# Patient Record
Sex: Female | Born: 1950 | Race: Black or African American | Hispanic: No | Marital: Married | Smoking: Never smoker
Health system: Southern US, Community
[De-identification: ages and names within clinical notes are randomized; demographics above are authoritative.]

## PROBLEM LIST (undated history)

## (undated) DIAGNOSIS — E7800 Pure hypercholesterolemia, unspecified: Secondary | ICD-10-CM

## (undated) DIAGNOSIS — I10 Essential (primary) hypertension: Secondary | ICD-10-CM

## (undated) HISTORY — PX: ABDOMINAL HYSTERECTOMY: SHX81

## (undated) HISTORY — PX: TONSILLECTOMY: SUR1361

## (undated) HISTORY — PX: ANKLE SURGERY: SHX546

---

## 2015-02-03 ENCOUNTER — Emergency Department (HOSPITAL_COMMUNITY)
Admission: EM | Admit: 2015-02-03 | Discharge: 2015-02-03 | Disposition: A | Discharge status: Home / Self Care | Payer: BLUE CROSS/BLUE SHIELD | Source: Home / Self Care | Attending: Family Medicine | Admitting: Family Medicine

## 2015-02-03 ENCOUNTER — Encounter (HOSPITAL_COMMUNITY): Payer: Self-pay | Admitting: Emergency Medicine

## 2015-02-03 ENCOUNTER — Emergency Department (INDEPENDENT_AMBULATORY_CARE_PROVIDER_SITE_OTHER): Payer: BLUE CROSS/BLUE SHIELD

## 2015-02-03 DIAGNOSIS — S92512A Displaced fracture of proximal phalanx of left lesser toe(s), initial encounter for closed fracture: Secondary | ICD-10-CM

## 2015-02-03 DIAGNOSIS — T1490 Injury, unspecified: Secondary | ICD-10-CM | POA: Diagnosis not present

## 2015-02-03 DIAGNOSIS — T1490XA Injury, unspecified, initial encounter: Secondary | ICD-10-CM

## 2015-02-03 NOTE — Discharge Instructions (Signed)
You have fractured your pinky toe. Use ibuprofen for pain. Please wear the postop shoe all during daytime hours. Buddy tape as desired for additional relief. Follow-up with Dr.Kendall next Tuesday, October 4 further treatment. Please call the office on Monday to schedule the appointment.

## 2015-02-03 NOTE — ED Notes (Signed)
The patient reported to the [facility] with a complaint of a toe injury. The patient stated that she hit the small toe on her left foot on the dresser about 3:30am this morning.

## 2015-02-03 NOTE — ED Provider Notes (Signed)
CSN: [id number]     Arrival date & time 02/03/15  1722 History   First MD Initiated Contact with Patient 02/03/15 1826     Chief Complaint  Patient presents with  . Toe Injury   (Consider location/radiation/quality/duration/timing/severity/associated sxs/prior Treatment) HPI  Left pinky toe pain. Started approximately 3 AM this morning when patient was ambulating back from using the bathroom. Patient states that she inadvertently kicked the dresser which caused immediate toe pain. Toes mobile but very tender to palpation. No laceration to the toe. Improves injury to the toe. Able to ambulate though this does cause some discomfort   t.History reviewed. No pertinent past medical history. History reviewed. No pertinent past surgical history. Family History  Problem Relation Age of Onset  . Diabetes Other    Social History  Substance Use Topics  . Smoking status: None  . Smokeless tobacco: None  . Alcohol Use: None   OB History    No data available     Review of Systems Per HPI with all other pertinent systems negative.   Allergies  Flexeril  Home Medications   Prior to Admission medications   Medication Sig Start Date End Date Taking? Authorizing Provider  atorvastatin (LIPITOR) 10 MG tablet Take 10 mg by mouth daily.   Yes Historical Provider, MD  lisinopril (PRINIVIL,ZESTRIL) 40 MG tablet Take 40 mg by mouth daily.   Yes Historical Provider, MD   Meds Ordered and Administered this Visit  Medications - No data to display  BP 118/73 mmHg  Pulse 72  Temp(Src) 98.2 F (36.8 C) (Oral)  Resp 20  SpO2 96% No data found.   Physical Exam Physical Exam  Constitutional: oriented to person, place, and time. appears well-developed and well-nourished. No distress.  HENT:  Head: Normocephalic and atraumatic.  Eyes: EOMI. PERRL.  Neck: Normal range of motion.  Cardiovascular: RRR, no m/r/g, 2+ distal pulses,  Pulmonary/Chest: Effort normal and breath sounds normal. No  respiratory distress.  Abdominal: Soft. Bowel sounds are normal. NonTTP, no distension.  Musculoskeletal: Detailed tender to palpation, no laceration, sensation intact, movement intact..  Neurological: alert and oriented to person, place, and time.  Skin: Skin is warm. No rash noted. non diaphoretic.  Psychiatric: normal mood and affect. behavior is normal. Judgment and thought content normal.   ED Course  Procedures (including critical care time)  Labs Review Labs Reviewed - No data to display  Imaging Review Dg Foot Complete Left  02/03/2015   CLINICAL DATA:  Stubbed left fifth toe on dresser, with pain. Initial encounter.  EXAM: LEFT FOOT - COMPLETE 3+ VIEW  COMPARISON:  None.  FINDINGS: There is a mildly displaced oblique fracture through the fifth proximal phalanx, without evidence of intra-articular extension. Surrounding soft tissue swelling is noted.  No additional fractures are seen. Visualized joint spaces are preserved. A small plantar calcaneal spur is noted.  IMPRESSION: Mildly displaced oblique fracture through the fifth proximal phalanx, without evidence of intra-articular extension.   Electronically Signed   By: Roanna Raider M.D.   On: 02/03/2015 18:55     Visual Acuity Review  Right Eye Distance:   Left Eye Distance:   Bilateral Distance:    Right Eye Near:   Left Eye Near:    Bilateral Near:         MDM   1. Closed disp fracture of proximal phalanx of lesser toe of left foot, initial encounter   2. Trauma    Fractures noted above and plain film. Discussed  case with Dr. Shon Baton of on-call [facility] orthopedic surgery. Recommending follow-up with Dr. Penni Bombard next Tuesday. Will place patient in postop shoe and recommending NSAIDs and ice as needed.    Ozella Rocks, MD 02/03/15 [phone]

## 2015-09-08 ENCOUNTER — Emergency Department (HOSPITAL_COMMUNITY)
Admission: EM | Admit: 2015-09-08 | Discharge: 2015-09-08 | Disposition: A | Discharge status: Home / Self Care | Payer: BLUE CROSS/BLUE SHIELD | Attending: Emergency Medicine | Admitting: Emergency Medicine

## 2015-09-08 ENCOUNTER — Encounter (HOSPITAL_COMMUNITY): Payer: Self-pay

## 2015-09-08 ENCOUNTER — Emergency Department (HOSPITAL_COMMUNITY): Payer: BLUE CROSS/BLUE SHIELD

## 2015-09-08 DIAGNOSIS — I10 Essential (primary) hypertension: Secondary | ICD-10-CM | POA: Insufficient documentation

## 2015-09-08 DIAGNOSIS — R079 Chest pain, unspecified: Secondary | ICD-10-CM

## 2015-09-08 DIAGNOSIS — R0789 Other chest pain: Secondary | ICD-10-CM | POA: Diagnosis present

## 2015-09-08 DIAGNOSIS — Z79899 Other long term (current) drug therapy: Secondary | ICD-10-CM | POA: Diagnosis not present

## 2015-09-08 DIAGNOSIS — E7800 Pure hypercholesterolemia, unspecified: Secondary | ICD-10-CM | POA: Diagnosis not present

## 2015-09-08 HISTORY — DX: Pure hypercholesterolemia, unspecified: E78.00

## 2015-09-08 HISTORY — DX: Essential (primary) hypertension: I10

## 2015-09-08 LAB — BASIC METABOLIC PANEL
ANION GAP: 10 (ref 5–15)
BUN: 19 mg/dL (ref 6–20)
CO2: 29 mmol/L (ref 22–32)
Calcium: 10.3 mg/dL (ref 8.9–10.3)
Chloride: 103 mmol/L (ref 101–111)
Creatinine, Ser: 1.04 mg/dL — ABNORMAL HIGH (ref 0.44–1.00)
GFR calc non Af Amer: 56 mL/min — ABNORMAL LOW (ref >60)
Glucose, Bld: 94 mg/dL (ref 65–99)
POTASSIUM: 4.1 mmol/L (ref 3.5–5.1)
Sodium: 142 mmol/L (ref 135–145)

## 2015-09-08 LAB — CBC
HEMATOCRIT: 41.5 % (ref 36.0–46.0)
HEMOGLOBIN: 13.6 g/dL (ref 12.0–15.0)
MCH: 28.4 pg (ref 26.0–34.0)
MCHC: 32.8 g/dL (ref 30.0–36.0)
MCV: 86.6 fL (ref 78.0–100.0)
Platelets: 231 10*3/uL (ref 150–400)
RBC: 4.79 MIL/uL (ref 3.87–5.11)
RDW: 13.2 % (ref 11.5–15.5)
WBC: 6.5 10*3/uL (ref 4.0–10.5)

## 2015-09-08 LAB — I-STAT TROPONIN, ED: TROPONIN I, POC: 0 ng/mL (ref 0.00–0.08)

## 2015-09-08 LAB — TROPONIN I: Troponin I: <0.03 ng/mL (ref <0.031)

## 2015-09-08 LAB — D-DIMER, QUANTITATIVE (NOT AT ARMC)

## 2015-09-08 MED ORDER — KETOROLAC TROMETHAMINE 30 MG/ML IJ SOLN
15.0000 mg | Freq: Once | INTRAMUSCULAR | Status: AC
  Administered 2015-09-08: 15 mg via INTRAVENOUS
  Filled 2015-09-08: qty 1

## 2015-09-08 MED ORDER — METHOCARBAMOL 500 MG PO TABS: 500.0000 mg | ORAL_TABLET | Freq: Two times a day (BID) | ORAL | Status: AC

## 2015-09-08 NOTE — ED Notes (Signed)
Unsuccessful IV attempts by this RN due to patient movement - attempted to calm patient prior to IV start without success.  Labs delayed due to inability to get IV access.

## 2015-09-08 NOTE — ED Provider Notes (Signed)
CSN: [id number]     Arrival date & time 09/08/15  1231 History   First MD Initiated Contact with Patient 09/08/15 1535     Chief Complaint  Patient presents with  . Chest Pain     (Consider location/radiation/quality/duration/timing/severity/associated sxs/prior Treatment) HPI Comments: Patient here complaining of left-sided chest tightness that has been waxing and waning 24 hours. No associated dyspnea, diaphoresis, nausea vomiting. No leg pain. No associated syncope or presyncope. No prior history of same.  Palpitations started last night but she attributes that to be started on Cardizem recently. No cough or congestion. She denies any history of cardiac disease. Feels at her baseline.  Patient is a 65 y.o. female presenting with chest pain. The history is provided by the patient.  Chest Pain   Past Medical History  Diagnosis Date  . Hypertension   . High cholesterol    Past Surgical History  Procedure Laterality Date  . Abdominal hysterectomy    . Ankle surgery    . Cesarean section     Family History  Problem Relation Age of Onset  . Diabetes Other    Social History  Substance Use Topics  . Smoking status: Never Smoker   . Smokeless tobacco: Never Used  . Alcohol Use: No   OB History    No data available     Review of Systems  Cardiovascular: Positive for chest pain.  All other systems reviewed and are negative.     Allergies  Clarithromycin; Flexeril; and Lisinopril  Home Medications   Prior to Admission medications   Medication Sig Start Date End Date Taking? Authorizing Provider  atorvastatin (LIPITOR) 10 MG tablet Take 10 mg by mouth daily.    Historical Provider, MD  lisinopril (PRINIVIL,ZESTRIL) 40 MG tablet Take 40 mg by mouth daily.    Historical Provider, MD   BP 139/52 mmHg  Pulse 64  Temp(Src) 98 F (36.7 C) (Oral)  Resp 19  Ht 5\' 1"  (1.549 m)  Wt 77.111 kg  BMI 32.14 kg/m2  SpO2 96% Physical Exam  Constitutional: She is oriented to  person, place, and time. She appears well-developed and well-nourished.  Non-toxic appearance. No distress.  HENT:  Head: Normocephalic and atraumatic.  Eyes: Conjunctivae, EOM and lids are normal. Pupils are equal, round, and reactive to light.  Neck: Normal range of motion. Neck supple. No tracheal deviation present. No thyroid mass present.  Cardiovascular: Normal rate, regular rhythm and normal heart sounds.  Exam reveals no gallop.   No murmur heard. Pulmonary/Chest: Effort normal and breath sounds normal. No stridor. No respiratory distress. She has no decreased breath sounds. She has no wheezes. She has no rhonchi. She has no rales.  Abdominal: Soft. Normal appearance and bowel sounds are normal. She exhibits no distension. There is no tenderness. There is no rebound and no CVA tenderness.  Musculoskeletal: Normal range of motion. She exhibits no edema or tenderness.  Neurological: She is alert and oriented to person, place, and time. She has normal strength. No cranial nerve deficit or sensory deficit. GCS eye subscore is 4. GCS verbal subscore is 5. GCS motor subscore is 6.  Skin: Skin is warm and dry. No abrasion and no rash noted.  Psychiatric: She has a normal mood and affect. Her speech is normal and behavior is normal.  Nursing note and vitals reviewed.   ED Course  Procedures (including critical care time) Labs Review Labs Reviewed  BASIC METABOLIC PANEL - Abnormal; Notable for the following:  Creatinine, Ser 1.04 (*)    GFR calc non Af Amer 56 (*)    All other components within normal limits  CBC  D-DIMER, QUANTITATIVE (NOT AT [facility])  TROPONIN I  Rosezena Sensor, ED    Imaging Review Dg Chest 2 View  09/08/2015  CLINICAL DATA:  Chest pain shortness of breath. EXAM: CHEST - 2 VIEW COMPARISON:  None. FINDINGS: The heart size and mediastinal contours are within normal limits. Both lungs are clear. The visualized skeletal structures are unremarkable. IMPRESSION: Negative  two view chest x-ray Electronically Signed   By: Marin Roberts M.D.   On: 09/08/2015 13:09   I have personally reviewed and evaluated these images and lab results as part of my medical decision-making.   EKG Interpretation   Date/Time:  Friday Sep 08 2015 12:42:30 EDT Ventricular Rate:  57 PR Interval:  180 QRS Duration: 83 QT Interval:  397 QTC Calculation: 386 R Axis:   42 Text Interpretation:  Sinus rhythm Low voltage, precordial leads RSR' in  V1 or V2, right VCD or RVH Confirmed by Maricela Schreur  MD, Gil Ingwersen ([id number]) on  09/08/2015 3:36:02 PM      MDM   Final diagnoses:  None   Patient with delta troponin that was negative. She also had d-dimer that was negative. Was given IV dose of Toradol.. Do not think that this represents ACS. She is stable for discharge     Lorre Nick, MD 09/08/15 [phone]

## 2015-09-08 NOTE — Discharge Instructions (Signed)
Nonspecific Chest Pain  °Chest pain can be caused by many different conditions. There is always a chance that your pain could be related to something serious, such as a heart attack or a blood clot in your lungs. Chest pain can also be caused by conditions that are not life-threatening. If you have chest pain, it is very important to follow up with your health care provider. °CAUSES  °Chest pain can be caused by: °· Heartburn. °· Pneumonia or bronchitis. °· Anxiety or stress. °· Inflammation around your heart (pericarditis) or lung (pleuritis or pleurisy). °· A blood clot in your lung. °· A collapsed lung (pneumothorax). It can develop suddenly on its own (spontaneous pneumothorax) or from trauma to the chest. °· Shingles infection (varicella-zoster virus). °· Heart attack. °· Damage to the bones, muscles, and cartilage that make up your chest wall. This can include: °¨ Bruised bones due to injury. °¨ Strained muscles or cartilage due to frequent or repeated coughing or overwork. °¨ Fracture to one or more ribs. °¨ Sore cartilage due to inflammation (costochondritis). °RISK FACTORS  °Risk factors for chest pain may include: °· Activities that increase your risk for trauma or injury to your chest. °· Respiratory infections or conditions that cause frequent coughing. °· Medical conditions or overeating that can cause heartburn. °· Heart disease or family history of heart disease. °· Conditions or health behaviors that increase your risk of developing a blood clot. °· Having had chicken pox (varicella zoster). °SIGNS AND SYMPTOMS °Chest pain can feel like: °· Burning or tingling on the surface of your chest or deep in your chest. °· Crushing, pressure, aching, or squeezing pain. °· Dull or sharp pain that is worse when you move, cough, or take a deep breath. °· Pain that is also felt in your back, neck, shoulder, or arm, or pain that spreads to any of these areas. °Your chest pain may come and go, or it may stay  constant. °DIAGNOSIS °Lab tests or other studies may be needed to find the cause of your pain. Your health care provider may have you take a test called an ambulatory ECG (electrocardiogram). An ECG records your heartbeat patterns at the time the test is performed. You may also have other tests, such as: °· Transthoracic echocardiogram (TTE). During echocardiography, sound waves are used to create a picture of all of the heart structures and to look at how blood flows through your heart. °· Transesophageal echocardiogram (TEE). This is a more advanced imaging test that obtains images from inside your body. It allows your health care provider to see your heart in finer detail. °· Cardiac monitoring. This allows your health care provider to monitor your heart rate and rhythm in real time. °· Holter monitor. This is a portable device that records your heartbeat and can help to diagnose abnormal heartbeats. It allows your health care provider to track your heart activity for several days, if needed. °· Stress tests. These can be done through exercise or by taking medicine that makes your heart beat more quickly. °· Blood tests. °· Imaging tests. °TREATMENT  °Your treatment depends on what is causing your chest pain. Treatment may include: °· Medicines. These may include: °¨ Acid blockers for heartburn. °¨ Anti-inflammatory medicine. °¨ Pain medicine for inflammatory conditions. °¨ Antibiotic medicine, if an infection is present. °¨ Medicines to dissolve blood clots. °¨ Medicines to treat coronary artery disease. °· Supportive care for conditions that do not require medicines. This may include: °¨ Resting. °¨ Applying heat   or cold packs to injured areas. °¨ Limiting activities until pain decreases. °HOME CARE INSTRUCTIONS °· If you were prescribed an antibiotic medicine, finish it all even if you start to feel better. °· Avoid any activities that bring on chest pain. °· Do not use any tobacco products, including  cigarettes, chewing tobacco, or electronic cigarettes. If you need help quitting, ask your health care provider. °· Do not drink alcohol. °· Take medicines only as directed by your health care provider. °· Keep all follow-up visits as directed by your health care provider. This is important. This includes any further testing if your chest pain does not go away. °· If heartburn is the cause for your chest pain, you may be told to keep your head raised (elevated) while sleeping. This reduces the chance that acid will go from your stomach into your esophagus. °· Make lifestyle changes as directed by your health care provider. These may include: °¨ Getting regular exercise. Ask your health care provider to suggest some activities that are safe for you. °¨ Eating a heart-healthy diet. A registered dietitian can help you to learn healthy eating options. °¨ Maintaining a healthy weight. °¨ Managing diabetes, if necessary. °¨ Reducing stress. °SEEK MEDICAL CARE IF: °· Your chest pain does not go away after treatment. °· You have a rash with blisters on your chest. °· You have a fever. °SEEK IMMEDIATE MEDICAL CARE IF:  °· Your chest pain is worse. °· You have an increasing cough, or you cough up blood. °· You have severe abdominal pain. °· You have severe weakness. °· You faint. °· You have chills. °· You have sudden, unexplained chest discomfort. °· You have sudden, unexplained discomfort in your arms, back, neck, or jaw. °· You have shortness of breath at any time. °· You suddenly start to sweat, or your skin gets clammy. °· You feel nauseous or you vomit. °· You suddenly feel light-headed or dizzy. °· Your heart begins to beat quickly, or it feels like it is skipping beats. °These symptoms may represent a serious problem that is an emergency. Do not wait to see if the symptoms will go away. Get medical help right away. Call your local emergency services (911 in the [place] Do not drive yourself to the hospital. °  °This  information is not intended to replace advice given to you by your health care provider. Make sure you discuss any questions you have with your health care provider. °  °Document Released: 01/30/2005 Document Revised: 05/13/2014 Document Reviewed: 11/26/2013 °Elsevier Interactive Patient Education ©2016 [employer] ° °

## 2015-09-08 NOTE — ED Notes (Signed)
Pt c/o 5/10 left sided cp described as "tightness" w/ radiation to left arm and lightheadedness. Pt denies n/v and diaphoresis. Pt denies cardiac hx. Pt A+OX4, speaking in complete sentences.

## 2015-09-08 NOTE — ED Notes (Signed)
Nurse drawing labs. 

## 2017-02-27 IMAGING — DX DG FOOT COMPLETE 3+V*L*
3 series · 3 of 3 positions shown · non-contrast
Comparison: None.

CLINICAL DATA: Stubbed left fifth toe on dresser, with pain.
Initial encounter.

EXAM:
LEFT FOOT - COMPLETE 3+ VIEW

[foot ap]
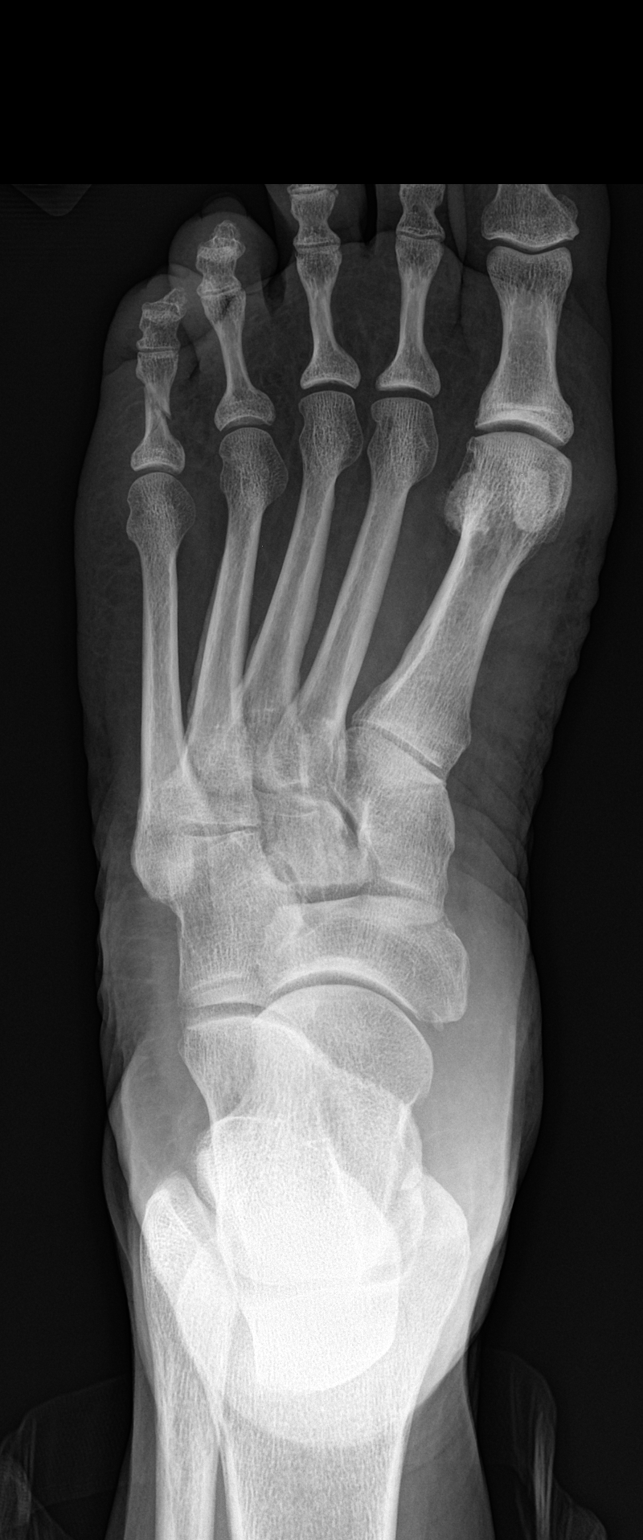

[foot obl]
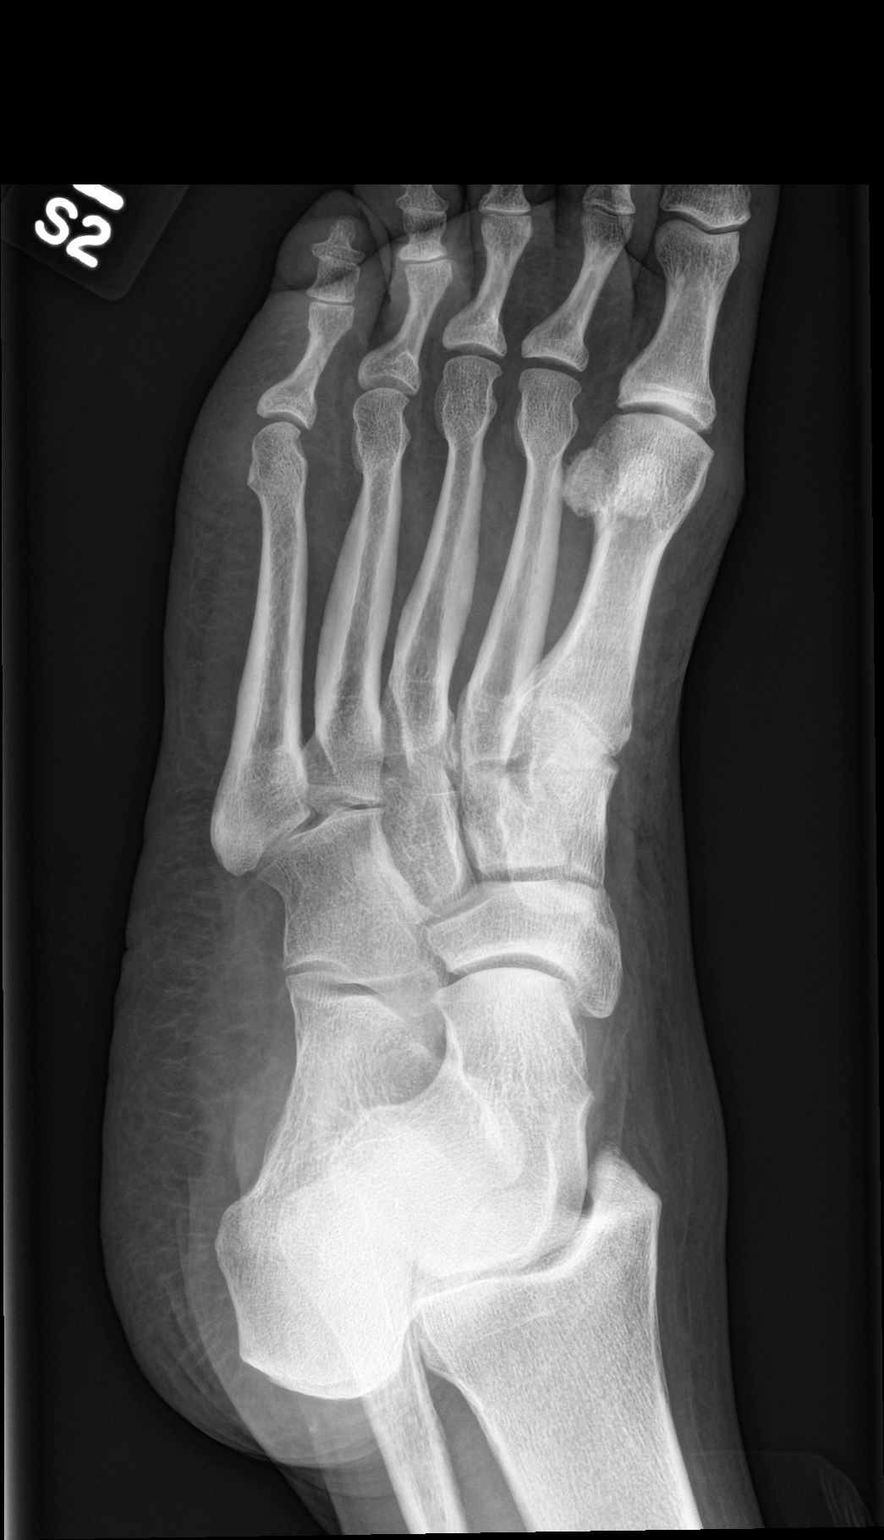

[foot lat]
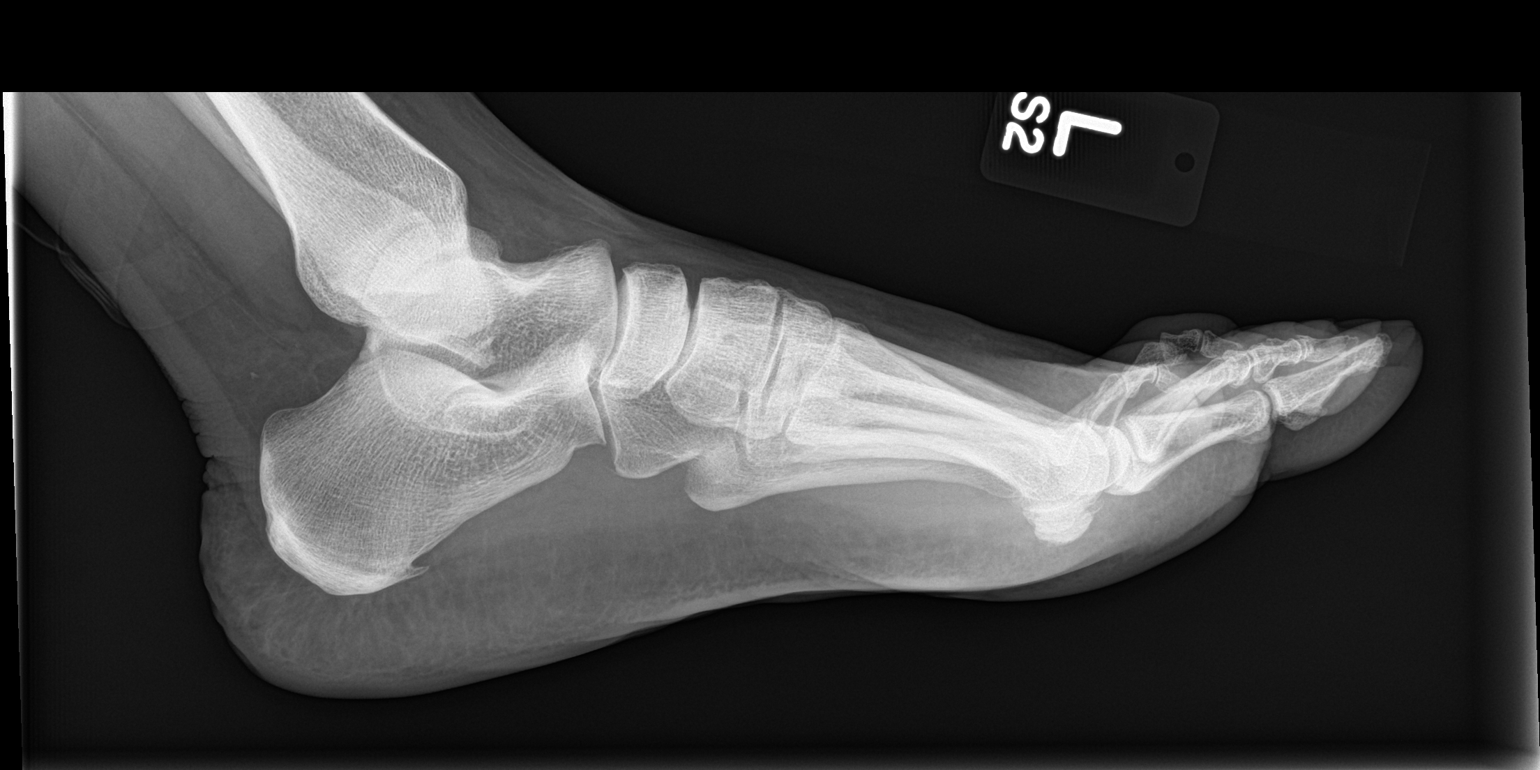

[3 of 3 positions shown; findings below may reference images not displayed]

FINDINGS: There is a mildly displaced oblique fracture through the fifth
proximal phalanx, without evidence of intra-articular extension.
Surrounding soft tissue swelling is noted.

No additional fractures are seen. Visualized joint spaces are
preserved. A small plantar calcaneal spur is noted.
IMPRESSION: Mildly displaced oblique fracture through the fifth proximal
phalanx, without evidence of intra-articular extension.

## 2017-10-02 IMAGING — CR DG CHEST 2V
3 series · 3 of 3 positions shown · non-contrast
Comparison: None.

CLINICAL DATA: Chest pain shortness of breath.

EXAM:
CHEST - 2 VIEW

[w chest pa]
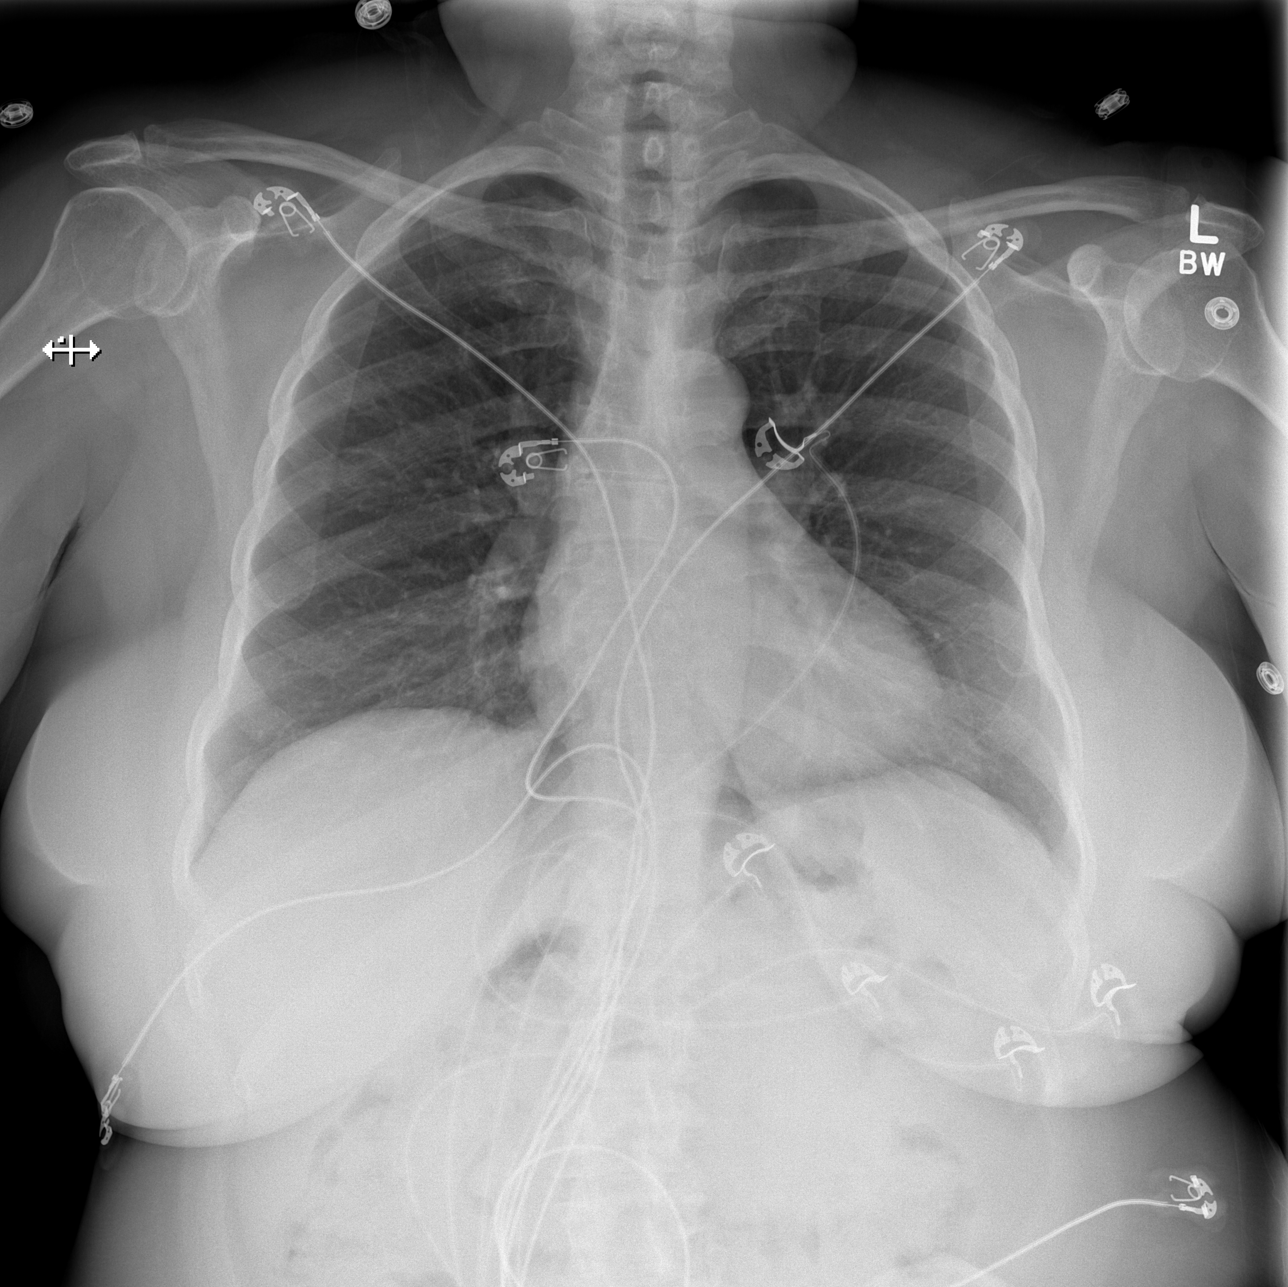

[w chest lat (1 of 2)]
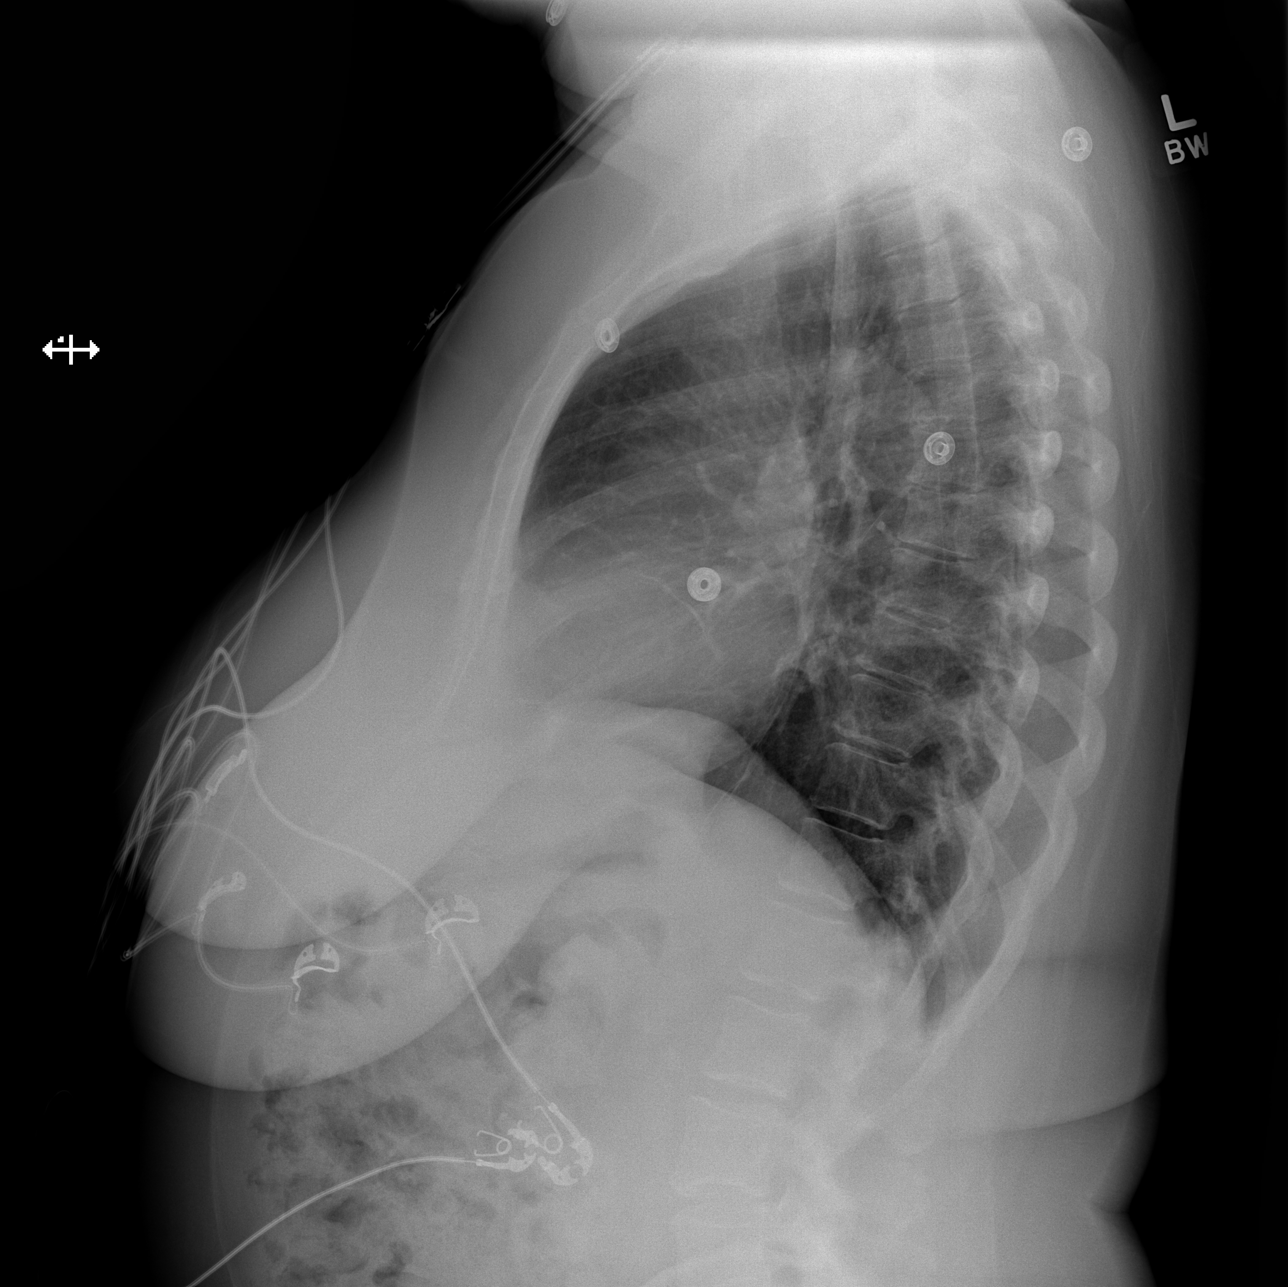

[w chest lat (2 of 2)]
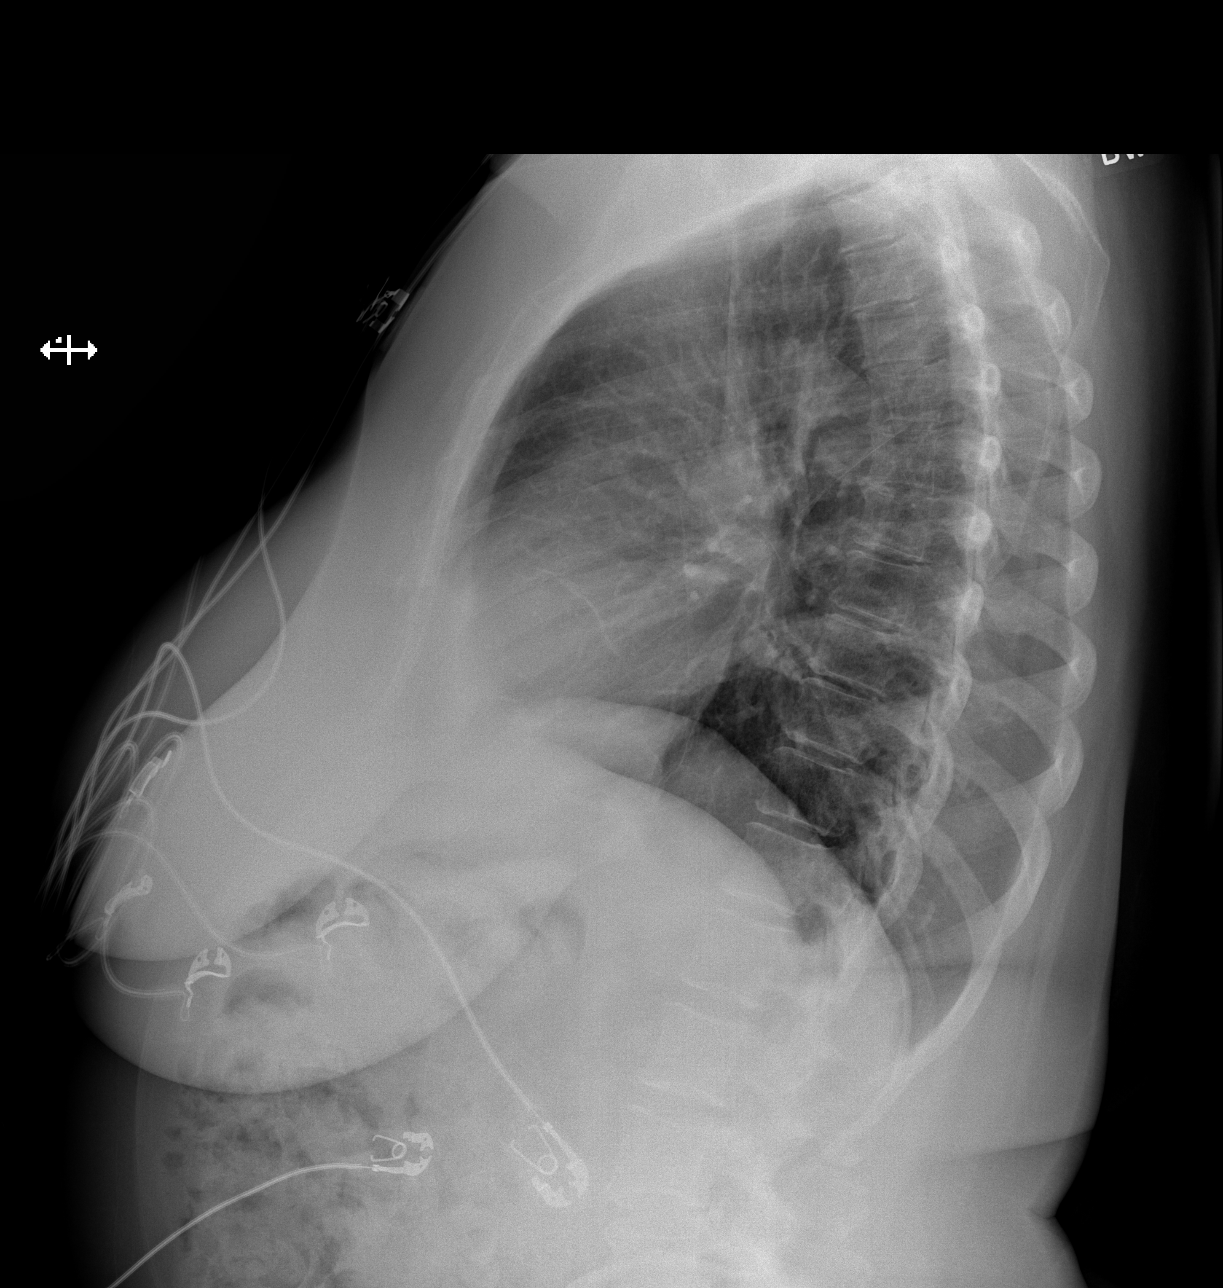

[3 of 3 positions shown; findings below may reference images not displayed]

FINDINGS: The heart size and mediastinal contours are within normal limits.
Both lungs are clear. The visualized skeletal structures are
unremarkable.
IMPRESSION: Negative two view chest x-ray

## 2019-06-12 ENCOUNTER — Ambulatory Visit: Payer: BLUE CROSS/BLUE SHIELD | Attending: Internal Medicine

## 2019-06-12 DIAGNOSIS — Z23 Encounter for immunization: Secondary | ICD-10-CM | POA: Insufficient documentation

## 2019-06-12 NOTE — Progress Notes (Signed)
   Covid-19 Vaccination Clinic  Name:  Lynn Farley    MRN: [id number] DOB: Sep 28, 1950  06/12/2019  [name] was observed post Covid-19 immunization for 15 minutes without incidence. She was provided with Vaccine Information Sheet and instruction to access the V-Safe system.   [name] was instructed to call 911 with any severe reactions post vaccine: Marland Kitchen Difficulty breathing  . Swelling of your face and throat  . A fast heartbeat  . A bad rash all over your body  . Dizziness and weakness    Immunizations Administered    Name Date Dose VIS Date Route   Moderna COVID-19 Vaccine 06/12/2019 12:00 PM 0.5 mL 04/06/2019 Intramuscular   Manufacturer: Moderna   Lot: [id number]   NDC: 45625-638-93

## 2019-07-13 ENCOUNTER — Ambulatory Visit: Payer: Self-pay | Attending: Internal Medicine

## 2019-07-13 DIAGNOSIS — Z23 Encounter for immunization: Secondary | ICD-10-CM | POA: Insufficient documentation

## 2019-07-13 NOTE — Progress Notes (Signed)
   Covid-19 Vaccination Clinic  Name:  Amaliya Whitelaw    MRN: [id number] DOB: 11-Sep-1950  07/13/2019  Ms. Shiffman was observed post Covid-19 immunization for 15 minutes without incident. She was provided with Vaccine Information Sheet and instruction to access the V-Safe system.   Ms. Mower was instructed to call 911 with any severe reactions post vaccine: Marland Kitchen Difficulty breathing  . Swelling of face and throat  . A fast heartbeat  . A bad rash all over body  . Dizziness and weakness   Immunizations Administered    Name Date Dose VIS Date Route   Moderna COVID-19 Vaccine 07/13/2019 12:42 PM 0.5 mL 04/06/2019 Intramuscular   Manufacturer: Moderna   Lot: [id number]   NDC: 14431-540-08

## 2020-04-12 ENCOUNTER — Other Ambulatory Visit (HOSPITAL_COMMUNITY): Payer: Self-pay | Admitting: Internal Medicine

## 2020-04-12 ENCOUNTER — Ambulatory Visit: Payer: Self-pay | Attending: Internal Medicine

## 2020-04-12 DIAGNOSIS — Z23 Encounter for immunization: Secondary | ICD-10-CM

## 2020-04-12 NOTE — Progress Notes (Signed)
   Covid-19 Vaccination Clinic  Name:  Lynn Farley    MRN: [id number] DOB: Mar 12, 1951  04/12/2020  [name] was observed post Covid-19 immunization for 15 minutes without incident. She was provided with Vaccine Information Sheet and instruction to access the V-Safe system.   Vaccinated by [facility]  [name] was instructed to call 911 with any severe reactions post vaccine: Marland Kitchen Difficulty breathing  . Swelling of face and throat  . A fast heartbeat  . A bad rash all over body  . Dizziness and weakness   Immunizations Administered    No immunizations on file.

## 2020-12-29 ENCOUNTER — Encounter (HOSPITAL_BASED_OUTPATIENT_CLINIC_OR_DEPARTMENT_OTHER): Payer: Self-pay

## 2020-12-29 ENCOUNTER — Emergency Department (HOSPITAL_BASED_OUTPATIENT_CLINIC_OR_DEPARTMENT_OTHER): Payer: Medicare Other

## 2020-12-29 ENCOUNTER — Other Ambulatory Visit: Payer: Self-pay

## 2020-12-29 DIAGNOSIS — U071 COVID-19: Secondary | ICD-10-CM | POA: Diagnosis not present

## 2020-12-29 DIAGNOSIS — I10 Essential (primary) hypertension: Secondary | ICD-10-CM | POA: Diagnosis not present

## 2020-12-29 DIAGNOSIS — R509 Fever, unspecified: Secondary | ICD-10-CM | POA: Diagnosis present

## 2020-12-29 NOTE — ED Triage Notes (Signed)
Pt reports +covid test today-c/o fever, cough, SOB-states she was advised by PCP to come to ED for CXR-NAD-steady gait

## 2020-12-30 ENCOUNTER — Emergency Department (HOSPITAL_BASED_OUTPATIENT_CLINIC_OR_DEPARTMENT_OTHER)
Admission: EM | Admit: 2020-12-30 | Discharge: 2020-12-30 | Disposition: A | Discharge status: Home / Self Care | Payer: Medicare Other | Attending: Emergency Medicine | Admitting: Emergency Medicine

## 2020-12-30 DIAGNOSIS — Z20822 Contact with and (suspected) exposure to covid-19: Secondary | ICD-10-CM

## 2020-12-30 MED ORDER — NIRMATRELVIR/RITONAVIR (PAXLOVID)TABLET: 3.0000 | ORAL_TABLET | Freq: Two times a day (BID) | ORAL | 0 refills | Status: AC

## 2020-12-30 NOTE — ED Provider Notes (Signed)
[facility] EMERGENCY DEPARTMENT Provider Note  CSN: [id number] Arrival date & time: 12/29/20 2117  Chief Complaint(s) Covid Positive  HPI Lynn Farley is a 70 y.o. female with a past medical history listed below who presents to the emergency department with 2 days of subjective fevers, cough, myalgias, sore throat.  Patient's husband tested positive for COVID-19 1 week ago.  She tested positive for COVID-19 yesterday.  She called her PCP tonight and was instructed to come in for chest x-ray.  Denies any nausea or vomiting.  No alleviating or aggravating factors. no other physical complaints.   The history is provided by the patient.   Past Medical History Past Medical History:  Diagnosis Date   High cholesterol    Hypertension    There are no problems to display for this patient.  Home Medication(s) Prior to Admission medications   Medication Sig Start Date End Date Taking? Authorizing Provider  nirmatrelvir/ritonavir EUA (PAXLOVID) 20 x 150 MG & 10 x 100MG  TABS Take 3 tablets by mouth 2 (two) times daily for 5 days. Patient GFR is 67. Take nirmatrelvir (150 mg) two tablets twice daily for 5 days and ritonavir (100 mg) one tablet twice daily for 5 days. 12/30/20 01/04/21 Yes Theresa Wedel, Amadeo GarnetPedro Eduardo, MD  atorvastatin (LIPITOR) 10 MG tablet Take 10 mg by mouth daily.    [provider]  chlorthalidone (HYGROTON) 25 MG tablet Take 12.5 mg by mouth daily. 09/06/15   [provider]  cholecalciferol (VITAMIN D) 1000 units tablet Take 1,000 Units by mouth daily.    [provider]  COVID-19 mRNA vaccine, Moderna, 100 MCG/0.5ML injection INJECT AS DIRECTED 04/12/20 04/12/21  Judyann MunsonSnider, Cynthia, MD  diltiazem (CARDIZEM CD) 120 MG 24 hr capsule Take 120 mg by mouth daily. 08/29/15   [provider]  methocarbamol (ROBAXIN) 500 MG tablet Take 1 tablet (500 mg total) by mouth 2 (two) times daily. 09/08/15   Lorre NickAllen, Anthony, MD                                                                                                                                     Past Surgical History Past Surgical History:  Procedure Laterality Date   ABDOMINAL HYSTERECTOMY     ANKLE SURGERY     CESAREAN SECTION     TONSILLECTOMY     Family History Family History  Problem Relation Age of Onset   Diabetes Other     Social History Social History   Tobacco Use   Smoking status: Never   Smokeless tobacco: Never  Vaping Use   Vaping Use: Never used  Substance Use Topics   Alcohol use: No   Drug use: No   Allergies Clarithromycin, Flexeril [cyclobenzaprine], and Lisinopril  Review of Systems Review of Systems All other systems are reviewed and are negative for acute change except as noted in the HPI  Physical Exam Vital Signs  I  have reviewed the triage vital signs BP (!) 155/78 (BP Location: Right Arm)   Pulse 84   Temp 98.5 F (36.9 C) (Oral)   Resp 18   Ht 5\' 1"  (1.549 m)   Wt 78.9 kg   SpO2 97%   BMI 32.88 kg/m   Physical Exam Vitals reviewed.  Constitutional:      General: She is not in acute distress.    Appearance: She is well-developed. She is not diaphoretic.  HENT:     Head: Normocephalic and atraumatic.     Nose: Nose normal.     Mouth/Throat:     Lips: No lesions.     Mouth: No oral lesions.     Tongue: No lesions.     Palate: No lesions.     Pharynx: No pharyngeal swelling or uvula swelling.     Tonsils: No tonsillar exudate.  Eyes:     General: No scleral icterus.       Right eye: No discharge.        Left eye: No discharge.     Conjunctiva/sclera: Conjunctivae normal.     Pupils: Pupils are equal, round, and reactive to light.  Cardiovascular:     Rate and Rhythm: Normal rate and regular rhythm.  Pulmonary:     Effort: Pulmonary effort is normal. No respiratory distress.     Breath sounds: Normal breath sounds. No stridor.  Abdominal:     General: There is no distension.     Palpations: Abdomen is soft.      Tenderness: There is no abdominal tenderness.  Musculoskeletal:        General: No tenderness.     Cervical back: Normal range of motion and neck supple.  Skin:    General: Skin is warm and dry.     Findings: No erythema or rash.  Neurological:     Mental Status: She is alert and oriented to person, place, and time.    ED Results and Treatments Labs (all labs ordered are listed, but only abnormal results are displayed) Labs Reviewed - No data to display                                                                                                                       EKG  EKG Interpretation  Date/Time:    Ventricular Rate:    PR Interval:    QRS Duration:   QT Interval:    QTC Calculation:   R Axis:     Text Interpretation:         Radiology DG Chest Portable 1 View  Result Date: 12/29/2020 CLINICAL DATA:  COVID-19 positive, fever EXAM: PORTABLE CHEST 1 VIEW COMPARISON:  09/08/2015 FINDINGS: Single frontal view of the chest demonstrates an unremarkable cardiac silhouette. Linear density at the left lung base consistent with subsegmental atelectasis or scarring. No airspace disease, effusion, or pneumothorax. IMPRESSION: 1. No acute intrathoracic process. Electronically Signed   By: 11/08/2015 M.D.   On: 12/29/2020  22:26    Pertinent labs & imaging results that were available during my care of the patient were reviewed by me and considered in my medical decision making (see MDM for details).  Medications Ordered in ED Medications - No data to display                                                                                                                                   Procedures Procedures  (including critical care time)  Medical Decision Making / ED Course I have reviewed the nursing notes for this encounter and the patient's prior records (if available in EHR or on provided paperwork).  Lusia Greis was evaluated in Emergency Department on  12/30/2020 for the symptoms described in the history of present illness. She was evaluated in the context of the global COVID-19 pandemic, which necessitated consideration that the patient might be at risk for infection with the SARS-CoV-2 virus that causes COVID-19. Institutional protocols and algorithms that pertain to the evaluation of patients at risk for COVID-19 are in a state of rapid change based on information released by regulatory bodies including the CDC and federal and state organizations. These policies and algorithms were followed during the patient's care in the ED.     Patient presents with viral symptoms for 2 days. Adequate oral hydration. Rest of history as above.  Patient appears well. No signs of toxicity, patient is interactive. No hypoxia, tachypnea or other signs of respiratory distress. No sign of clinical dehydration. Rest of exam as above.  CXR negative.  Most consistent with viral illness. Covid given exposure.  No evidence suggestive of pharyngitis, AOM, PNA.   Discussed symptomatic treatment with the patient and they will follow closely with their PCP.   Pertinent labs & imaging results that were available during my care of the patient were reviewed by me and considered in my medical decision making:  Rx'd Paxlovid.  Final Clinical Impression(s) / ED Diagnoses Final diagnoses:  Contact with and (suspected) exposure to covid-19    The patient appears reasonably screened and/or stabilized for discharge and I doubt any other medical condition or other [facility] requiring further screening, evaluation, or treatment in the ED at this time prior to discharge. Safe for discharge with strict return precautions.  Disposition: Discharge  Condition: Good  I have discussed the results, Dx and Tx plan with the patient/family who expressed understanding and agree(s) with the plan. Discharge instructions discussed at length. The patient/family was given strict return  precautions who verbalized understanding of the instructions. No further questions at time of discharge.    ED Discharge Orders          Ordered    nirmatrelvir/ritonavir EUA (PAXLOVID) 20 x 150 MG & 10 x 100MG  TABS  2 times daily        12/30/20 0219             Follow Up:  Alain Honey, MD [address] [place] [address] [phone]  Call  as needed    This chart was dictated using voice recognition software.  Despite best efforts to proofread,  errors can occur which can change the documentation meaning.    Nira Conn, MD 12/30/20 [phone]

## 2020-12-30 NOTE — Discharge Instructions (Addendum)
Stop taking your Atorvastatin while taking Paxlovid.

## 2023-03-06 ENCOUNTER — Ambulatory Visit: Payer: Medicare Other

## 2023-03-06 ENCOUNTER — Ambulatory Visit (INDEPENDENT_AMBULATORY_CARE_PROVIDER_SITE_OTHER): Payer: Medicare Other | Admitting: Podiatry

## 2023-03-06 ENCOUNTER — Other Ambulatory Visit: Payer: Self-pay | Admitting: Podiatry

## 2023-03-06 ENCOUNTER — Encounter: Payer: Self-pay | Admitting: Podiatry

## 2023-03-06 DIAGNOSIS — M21612 Bunion of left foot: Secondary | ICD-10-CM | POA: Diagnosis not present

## 2023-03-06 DIAGNOSIS — M79672 Pain in left foot: Secondary | ICD-10-CM

## 2023-03-06 DIAGNOSIS — M65972 Unspecified synovitis and tenosynovitis, left ankle and foot: Secondary | ICD-10-CM

## 2023-03-06 DIAGNOSIS — M109 Gout, unspecified: Secondary | ICD-10-CM

## 2023-03-06 MED ORDER — TRIAMCINOLONE ACETONIDE 10 MG/ML IJ SUSP
2.5000 mg | Freq: Once | INTRAMUSCULAR | Status: AC
  Administered 2023-03-06: 2.5 mg via INTRA_ARTICULAR

## 2023-03-06 NOTE — Patient Instructions (Signed)
Low-Purine Eating Plan A low-purine eating plan involves making food choices to limit your purine intake. Purine is a kind of uric acid. Too much uric acid in your blood can cause certain conditions, such as gout and kidney stones. Eating a low-purine diet may help control these conditions. What are tips for following this plan? Shopping Avoid buying products that contain high-fructose corn syrup. Check for this on food labels. It is commonly found in many processed foods and soft drinks. Be sure to check for it in baked goods such as cookies, canned fruits, and cereals and cereal bars. Avoid buying veal, chicken breast with skin, lamb, and organ meats such as liver. These types of meats tend to have the highest purine content. Choose dairy products. These may lower uric acid levels. Avoid certain types of fish. Not all fish and seafood have high purine content. Examples with high purine content include anchovies, trout, tuna, sardines, and salmon. Avoid buying beverages that contain alcohol, particularly beer and hard liquor. Alcohol can affect the way your body gets rid of uric acid. Meal planning  Learn which foods do or do not affect you. If you find out that a food tends to cause your gout symptoms to flare up, avoid eating that food. You can enjoy foods that do not cause problems. If you have any questions about a food item, talk with your dietitian or health care provider. Reduce the overall amount of meat in your diet. When you do eat meat, choose ones with lower purine content. Include plenty of fruits and vegetables. Although some vegetables may have a high purine content--such as asparagus, mushrooms, spinach, or cauliflower--it has been shown that these do not contribute to uric acid blood levels as much. Consume at least 1 dairy serving a day. This has been shown to decrease uric acid levels. General information If you drink alcohol: Limit how much you have to: 0-1 drink a day for  women who are not pregnant. 0-2 drinks a day for men. Know how much alcohol is in a drink. In the [place] one drink equals one 12 oz bottle of beer (355 mL), one 5 oz glass of wine (148 mL), or one 1 oz glass of hard liquor (44 mL). Drink plenty of water. Try to drink enough to keep your urine pale yellow. Fluids can help remove uric acid from your body. Work with your health care provider and dietitian to develop a plan to achieve or maintain a healthy weight. Losing weight may help reduce uric acid in your blood. What foods are recommended? The following are some types of foods that are good choices when limiting purine intake: Fresh or frozen fruits and vegetables. Whole grains, breads, cereals, and pasta. Rice. Beans, peas, legumes. Nuts and seeds. Dairy products. Fats and oils. The items listed above may not be a complete list. Talk with a dietitian about what dietary choices are best for you. What foods are not recommended? Limit your intake of foods high in purines, including: Beer and other alcohol. Meat-based gravy or sauce. Canned or fresh fish, such as: Anchovies, sardines, herring, salmon, and tuna. Mussels and scallops. Codfish, trout, and haddock. Bacon, veal, chicken breast with skin, and lamb. Organ meats, such as: Liver or kidney. Tripe. Sweetbreads (thymus gland or pancreas). Wild Education officer, environmental. Yeast or yeast extract supplements. Drinks sweetened with high-fructose corn syrup, such as soda. Processed foods made with high-fructose corn syrup. The items listed above may not be a complete list of foods  and beverages you should limit. Contact a dietitian for more information. Summary Eating a low-purine diet may help control conditions caused by too much uric acid in the body, such as gout or kidney stones. Choose low-purine foods, limit alcohol, and limit high-fructose corn syrup. You will learn over time which foods do or do not affect you. If you find out that a  food tends to cause your gout symptoms to flare up, avoid eating that food. This information is not intended to replace advice given to you by your health care provider. Make sure you discuss any questions you have with your health care provider. Document Revised: 04/05/2021 Document Reviewed: 04/05/2021 Elsevier Patient Education  2024 [employer].

## 2023-03-06 NOTE — Progress Notes (Signed)
  Subjective:  Patient ID: Lynn Farley, female    DOB: 1951/02/04,   MRN: [id number]  Chief Complaint  Patient presents with   Foot Pain    Callus at the left bunion that is causing pain, has tried callus remover medicine, pain is worse when she is walking and in shoes     72 y.o. female presents for concern of a left foot bunion and callus on her foot. Relates she has had the callus for years but relates last week started to get more pain in the foot suddenly. Relates swelling and pain that got worse and has been getting better. She has been using a callus remover on the area. Relates any touch is painful to the area. Does have a family history of gout and relates eating some shrimp recently. Denies any other pedal complaints. Denies n/v/f/c.   Past Medical History:  Diagnosis Date   High cholesterol    Hypertension     Objective:  Physical Exam: Vascular: DP/PT pulses 2/4 bilateral. CFT <3 seconds. Normal hair growth on digits. No edema.  Skin. No lacerations or abrasions bilateral feet.  Musculoskeletal: MMT 5/5 bilateral lower extremities in DF, PF, Inversion and Eversion. Deceased ROM in DF of ankle joint. 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 There is edema and erythema around the left first MPJ and pain dorsally as well as with Rom of the first MPJ.  Neurological: Sensation intact to light touch.   Assessment:   1. Synovitis of left foot   2. Bunion, left   3. 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 -Xrays reviewed. 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  -Discussed treatement options for gouty arthritis and gout education provided. -Patient opted for injection. After oral consent, injected left first MPJ with 1cc lidocaine and marcaine plain mixed with 1 cc kenalgo without  complication; post injection care explained. -Discussed diet and modifications.  -Avoid oral steroid as has been on a lot recently.  -Advised patient to call if symptoms are not improved within 1 week -Patient to return in 3 weeks for re-check/further discussion for long term management of gout or sooner if condition worsens.   Louann Sjogren, DPM

## 2023-03-27 ENCOUNTER — Ambulatory Visit (INDEPENDENT_AMBULATORY_CARE_PROVIDER_SITE_OTHER): Payer: Medicare Other | Admitting: Podiatry

## 2023-03-27 DIAGNOSIS — M109 Gout, unspecified: Secondary | ICD-10-CM | POA: Diagnosis not present

## 2023-03-27 DIAGNOSIS — M65972 Unspecified synovitis and tenosynovitis, left ankle and foot: Secondary | ICD-10-CM | POA: Diagnosis not present

## 2023-03-27 MED ORDER — COLCHICINE 0.6 MG PO TABS: 0.6000 mg | ORAL_TABLET | Freq: Every day | ORAL | 0 refills | Status: DC

## 2023-03-27 NOTE — Progress Notes (Signed)
  Subjective:  Patient ID: Lynn Farley, female    DOB: 06-14-1950,   MRN: [id number]  No chief complaint on file.   72 y.o. female presents for follow-up of gout flare of left foot. Relates doing better the injection helped almost immediately and has not had any issues. She relates she is going to avoid shrimp as she believes that was the cause. Denies any other pedal complaints. Denies n/v/f/c.   Past Medical History:  Diagnosis Date   High cholesterol    Hypertension     Objective:  Physical Exam: Vascular: DP/PT pulses 2/4 bilateral. CFT <3 seconds. Normal hair growth on digits. No edema.  Skin. No lacerations or abrasions bilateral feet.  Musculoskeletal: MMT 5/5 bilateral lower extremities in DF, PF, Inversion and Eversion. Deceased ROM in DF of ankle joint. 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 No pain around first MPJ and edema and erythema improved.  Neurological: Sensation intact to light touch.   Assessment:   1. 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 Synovitis of left foot       Plan:  Patient was evaluated and treated and all questions answered. -Xrays reviewed. 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  -Discussed treatement options for gouty arthritis and gout education provided. -Unable to take oral steroids and interactions with colchicine so will plan to avoid shrimp and if needed will come in for injection of gout flare.   -Patient to return as needed   Louann Sjogren, DPM

## 2023-05-30 ENCOUNTER — Ambulatory Visit (INDEPENDENT_AMBULATORY_CARE_PROVIDER_SITE_OTHER): Payer: Medicare Other | Admitting: Podiatry

## 2023-05-30 ENCOUNTER — Encounter: Payer: Self-pay | Admitting: Podiatry

## 2023-05-30 DIAGNOSIS — L84 Corns and callosities: Secondary | ICD-10-CM | POA: Diagnosis not present

## 2023-05-30 DIAGNOSIS — M21612 Bunion of left foot: Secondary | ICD-10-CM

## 2023-05-30 NOTE — Progress Notes (Signed)
  Subjective:  Patient ID: Lynn Farley, female    DOB: Sep 03, 1950,   MRN: [id number]  No chief complaint on file.   73 y.o. female presents for follow-up of  left foot pain. Relates this time her pain is in the same area but does not feel like a gout flare. Relates an area on the inside of her bunion that has been painful. Has been using callus ointment on it and has still been painful. Relates it may have started after wearing a certain pair of shoes. . Denies any other pedal complaints. Denies n/v/f/c.   Past Medical History:  Diagnosis Date   High cholesterol    Hypertension     Objective:  Physical Exam: Vascular: DP/PT pulses 2/4 bilateral. CFT <3 seconds. Normal hair growth on digits. No edema.  Skin. No lacerations or abrasions bilateral feet.  Musculoskeletal: MMT 5/5 bilateral lower extremities in DF, PF, Inversion and Eversion. Deceased ROM in DF of ankle joint. 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 No pain around first MPJ and edema and erythema improved.  Neurological: Sensation intact to light touch.   Assessment:   1. Bunion, left   2. Callus        Plan:  Patient was evaluated and treated and all questions answered. -Xrays reviewed. 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  -Discussed treatement options for gouty arthritis  and callus/HAV deofrmity  education provided. -hyperkeratotic lesion debrided today as courtesy. Discussed options to help reduce pain with this.  Discussed possible need for surgery in the future  Deferred injection today.  -Patient to return as needed   Louann Sjogren, DPM

## 2023-09-16 NOTE — Progress Notes (Signed)
 " Subjective:   Lynn Farley is a 73 y.o. female with OSA on CPAP, HTN, Dyslipidemia, vitamin D/B12 deficiency, chronic urticaria who presents to the clinic today for a follow up visit.  Chief Complaint  Patient presents with   Follow-up    Pain under left arm    HPI: Patient was last seen by me on 4/7 for follow-up.  She had been endorsing left axillary pain for which a mammogram was completed which was negative.  The axillary pain had resolved on its own without intervention at her last visit.  She was advised to let me know if symptoms recurred.   Today, she states that the pain in her left armpit has recurred without an obvious identifiable trigger.  Previously, she had reported pain in her left axilla which was described as a nagging sensation.  Today, she states that it is more sharp and painful than before.  She denies lifting any heavy objects, recent increase in physical activity.  Denies chest pain/pressure, shortness of breath, numbness or tingling in her arms.  The pain does not radiate.  The only new symptom that has developed is left-sided shoulder pain over the [facility] joint.  She currently follows with an orthopedic physician at [facility] and plans to discuss these concerns with him.  Denies muscle weakness, rashes, or change in deodorants. Denies constitutional symptoms.   At her last visit, her blood pressure was suboptimally controlled on amlodipine 10 mg daily.  Low-dose chlorthalidone 12.5 mg was added and she was advised to continue magnesium supplementation.  Today, she reports overall doing well with this regimen. She is tolerating medication well without adverse effects including lightheadedness/dizziness. She has been taking BP at home, generally running in the 130's/70's-80's.   ROS:  Except as noted above, a full 10 point ROS was completed with the patient and was unremarkable.   Past Medical History:  Past Medical History:  Diagnosis Date   Allergy    Arthritis     Cataract    Enlarged heart    Glaucoma    Hypercholesterolemia    Hyperlipidemia    Hypertension    Hypertension with goal to be determined    Keratoconjunctivitis sicca not specified as Sjogren's, bilateral    Retinal detachment    Right Eye    Current Medications:  Current Outpatient Medications  Medication Sig Dispense Refill   acetaminophen (TYLENOL) 500 mg tablet Take 500 mg by mouth daily as needed for mild pain (1-3).     amLODIPine (NORVASC) 10 mg tablet Take 1 tablet (10 mg total) by mouth daily. 90 tablet 3   aspirin 81 mg chewable tablet Chew 1 tablet (81 mg total) daily. 30 tablet 2   atorvastatin (LIPITOR) 20 mg tablet Take 1 tablet (20 mg total) by mouth at bedtime. 90 tablet 3   carboxymethylcellulose (Refresh Tears) 0.5 % drop ophthalmic solution Administer 1 drop into each eyes daily as needed for dry eyes.     cetirizine (ZyrTEC) 10 mg tablet Take 1 tablet (10 mg total) by mouth daily. (Patient taking differently: Take 10 mg by mouth as needed.) 30 tablet 1   chlorthalidone (HYGROTON) 25 mg tablet Take 0.5 tablets (12.5 mg total) by mouth daily. 45 tablet 3   CHOLECALCIFEROL, VITAMIN D3, ORAL Take 1 tablet by mouth daily.     cyanocobalamin (VITAMIN B12) 1,000 mcg tablet Take 1,000 mcg by mouth Once Daily.     cycloSPORINE (RESTASIS) 0.05 % ophthalmic emulsion Administer 1 drop into each  eyes 2 (two) times a day. 180 each 3   diclofenac sodium (VOLTAREN) 1 % gel APPLY 2 GRAMS TOPICALLY TO THE AFFECTED AREA FOUR TIMES DAILY     famotidine (PEPCID) 40 mg tablet Take 40 mg by mouth daily. PRN     magnesium oxide 400 mg (241 mg magnesium) tab TAKE 1 TABLET(400 MG) BY MOUTH TWICE DAILY 60 tablet 1   phytonadione, vitamin K1, (VITAMIN K) 100 mcg tablet Take 150 mcg by mouth 2 (two) times weekly. Tuesday and thursday     No current facility-administered medications for this visit.   Surgical History: Past Surgical History:  Procedure Laterality  Date   ANKLE SURGERY Right    Procedure: ANKLE SURGERY; screw placement    BREAST BIOPSY     Procedure: BREAST BIOPSY   CESAREAN SECTION, UNSPECIFIED     Procedure: CESAREAN SECTION; 2    COLONOSCOPY     Procedure: COLONOSCOPY   HYSTERECTOMY      Procedure: HYSTERECTOMY   OTHER SURGICAL HISTORY Right 02/04/2017   Procedure: OTHER SURGICAL HISTORY (SLT )   OTHER SURGICAL HISTORY Left 02/11/2017   Procedure: OTHER SURGICAL HISTORY (SLT)   RETINAL DETACHMENT SURGERY Right    Procedure: RETINAL DETACHMENT SURGERY; repair retinal tear   TONSILLECTOMY     Procedure: TONSILLECTOMY     Family History:  Family History  Problem Relation Name Age of Onset   Diabetes Mother     Cataracts Mother     Hypertension Mother     Diabetes Father     Glaucoma Father     Cataracts Father     Hypertension Father     Hyperlipidemia Sister     Hypertension Sister     Breast cancer Maternal Aunt Mat Aunt    Stroke Maternal Grandmother     Cataracts Maternal Grandmother     Tuberculosis Maternal Grandfather     Cataracts Maternal Grandfather     Aneurysm Paternal Grandmother     Cataracts Paternal Grandmother     Cataracts Paternal Grandfather     Hypertension Son     Macular degeneration Neg Hx       Social History:  Social History   Socioeconomic History   Marital status: Married    Spouse name: Not on file   Number of children: Not on file   Years of education: Not on file   Highest education level: Not on file  Occupational History   Not on file  Tobacco Use   Smoking status: Never    Passive exposure: Never   Smokeless tobacco: Never  Vaping Use   Vaping status: Never Used  Substance and Sexual Activity   Alcohol use: Never   Drug use: Never   Sexual activity: Not on file  Other Topics Concern   Not on file  Social History Narrative   Patient is married (since 74)  Lives in [place] since 2015  Previously from [place]  but  then moved [place], [place] 2013 but moved her after husband had aortic dissection.  .   Social Drivers of Health   Food Insecurity: Low Risk  (01/23/2023)   Food vital sign    Within the past 12 months, you worried that your food would run out before you got money to buy more: Never true    Within the past 12 months, the food you bought just didn't last and you didn't have money to get more: Never true  Transportation Needs: No Transportation Needs (01/23/2023)   Transportation  In the past 12 months, has lack of reliable transportation kept you from medical appointments, meetings, work or from getting things needed for daily living? : No  Safety: Low Risk  (09/16/2023)   Safety    How often does anyone, including family and friends, physically hurt you?: Never    How often does anyone, including family and friends, insult or talk down to you?: Never    How often does anyone, including family and friends, threaten you with harm?: Never    How often does anyone, including family and friends, scream or curse at you?: Never  Living Situation: Low Risk  (01/23/2023)   Living Situation    What is your living situation today?: I have a steady place to live    Think about the place you live. Do you have problems with any of the following? Choose all that apply:: None/None on this list    Objective:   BP 132/82 (BP Location: Left arm, Patient Position: Sitting)   Pulse 99   Temp 98.4 F (36.9 C) (Oral)   Resp 16   Ht 1.549 m (5' 1)   Wt 82.6 kg (182 lb 3.2 oz)   SpO2 99%   BMI 34.43 kg/m  Physical Exam Vitals reviewed.  Constitutional:      General: She is not in acute distress.    Appearance: Normal appearance. She is not ill-appearing or toxic-appearing.  HENT:     Head: Normocephalic and atraumatic.     Right Ear: External ear normal.     Left Ear: External ear normal.     Nose: Nose normal.     Mouth/Throat:     Mouth: Mucous membranes are moist.  Eyes:     General: No  scleral icterus.    Conjunctiva/sclera: Conjunctivae normal.  Cardiovascular:     Rate and Rhythm: Normal rate and regular rhythm.     Heart sounds: No murmur heard.    No friction rub. No gallop.  Pulmonary:     Effort: Pulmonary effort is normal. No respiratory distress.     Breath sounds: No wheezing, rhonchi or rales.  Abdominal:     General: There is no distension.     Palpations: Abdomen is soft.  Musculoskeletal:     Left shoulder: Tenderness (TTP over the left AC joint) present. No swelling or deformity.     Right lower leg: No edema.     Left lower leg: No edema.  Lymphadenopathy:     Comments: No axillary Lymphadenopathy palpated Tenderness to palpation over the medial axilla  No skin rashes or lesions noted in the armpit   Skin:    General: Skin is warm and dry.  Neurological:     General: No focal deficit present.     Mental Status: She is alert and oriented to person, place, and time.  Psychiatric:        Mood and Affect: Mood normal.        Behavior: Behavior normal.        Thought Content: Thought content normal.        Judgment: Judgment normal.     Lab Results  Component Value Date   WBC 7.50 01/21/2023   HGB 13.5 01/21/2023   HCT 39.7 01/21/2023   PLT 208 01/21/2023   CHOL 209 (H) 01/20/2023   TRIG 84 01/20/2023   HDL 54 (L) 01/20/2023   ALT 12 01/20/2023   AST 14 01/20/2023   NA 142 08/11/2023   K 4.1  08/11/2023   CL 103 08/11/2023   CREATININE 0.78 08/11/2023   BUN 11 08/11/2023   CO2 32 (H) 08/11/2023   TSH 3.723 01/20/2023   HGBA1C 5.8 (H) 01/21/2023   Lab Results  Component Value Date   HGBA1C 5.8 (H) 01/21/2023    Assessment/Plan:   Lynn Farley is a 73 y.o. female with OSA on CPAP, HTN, Dyslipidemia, vitamin D/B12 deficiency, chronic urticaria who presents to the clinic today for a follow up visit.   1. Essential hypertension (Primary) BP well controlled on current regimen. Continue. Update BMP/Mg levels today.  - Basic  Metabolic Panel; Future - Magnesium; Future  2. Axillary pain, left 3. Acute pain of left shoulder Unclear etiology of symptoms - she has had diagnostic mammogram which was unremarkable. Symptoms had resolved at last visit but have now recurred again, with new pain/discomfort in the left shoulder joint. She has no palpable LAD on exam. Plan to obtain plain films of the shoulder joint. She plans to discuss shoulder/axillary pain with her Orthopedist through [facility] to see if MSK etiology of symptoms. Obtain Axillary US . Can try OTC NSAIDs for Pain PRN. Alert clinic with any new symptoms.  - XR Shoulder Minimum 2 Views Left; Future - US  Ext Joint or Soft Tissue NonVas Ltd Lt; Future  Return in 1 month for follow up; or sooner as needed.   The above plan was discussed with patient and they expressed understanding.   Margaret A. Lelon, DO  Internal Medicine  [facility] - Internal Medicine [place]          "

## 2023-12-31 NOTE — Progress Notes (Signed)
 " Chief Complaint  Patient presents with   Sore Throat     HPI: Lynn Farley is a 73 y.o. female patient of Dr. Lelon with OSA on CPAP, HTN, Dyslipidemia, vitamin D/B12 deficiency, chronic urticaria who presents to clinic today for sore throat and cough. She states last week, her glands were swollen but she felt acutely worse yesterday with headache, chills, subjective fever, and minimally productive cough. She states her chest feels a little tight. She has been gargling her throat. No known sick contacts. She has not done a COVID test.    Past Medical History: Problem List[1]   Surgical History[2]  Medications: Medications Ordered Prior to Encounter[3]  Allergies: Allergies[4]  Family History: Family History[5]  Social History: Social History[6]  Review of Systems: A complete review of systems was obtained and is negative other than what is stated in the HPI.      Objective:   Physical Exam BP 138/80   Pulse 94   Temp 99.1 F (37.3 C) (Temporal)   Ht 1.549 m (5' 1)   Wt 82.1 kg (181 lb)   SpO2 94%   BMI 34.20 kg/m  Gen: Well appearing, well nourished, no acute distress Eyes: conjunctiva non-injected, no scleral icterus, PERRL ENT: normal TMs bilaterally. Boggy nasal turbinates. Posterior pharynx mildly erythematous & cobblestoned. Mild maxillary sinus tenderness. Neck: supple, no LAD, no thyromegaly, no masses or nodules Pulm: CTAB, no rales, rhonchi or wheezing CV: RRR, s1s2 heard with no rubs, murmurs or gallops, no bruits, No LE edema     Assessment:     Lynn Farley is a 73 y.o. female who presents to clinic with  Problem List[7]    Plan:     1. Acute cough  POC Rapid Influenza A & B Antigen     Rapid flu negative. Unfortunately we are not able to perform COVID testing in office at this time, so patient was encouraged to complete the COVID test she has at home and contact office with the results. If home COVID negative, would treat  supportively for viral URI. Discussed Tylenol, oral hydration, continue salt water gargles, Chloraseptic spray/lozenges, Flonase for post-nasal drainage. She will let us  know if symptoms do not improve or worsen over the next week.   This plan was dicussed with the patient who expressed understanding.   Return if symptoms worsen or fail to improve.       [1] Patient Active Problem List Diagnosis   Essential hypertension   Hyperlipidemia   Chronic urticaria   Multiple food allergies   Lumbar back pain with radiculopathy affecting left lower extremity   Low back pain   Greater trochanteric bursitis of right hip   Laryngopharyngeal reflux (LPR)   Glaucoma suspect of both eyes   Nuclear sclerotic cataract of both eyes   Cortical age-related cataract of both eyes   Posterior vitreous detachment of both eyes   Myopia with astigmatism and presbyopia, bilateral   Primary open angle glaucoma of both eyes, mild stage   Family history of glaucoma   Keratoconjunctivitis sicca not specified as Sjogren's, bilateral   Vitamin D deficiency   Hypomagnesemia   Class 1 obesity due to excess calories with serious comorbidity and body mass index (BMI) of 34.0 to 34.9 in adult   Sleep disorder breathing   Obstructive sleep apnea  [2] Past Surgical History: Procedure Laterality Date   ANKLE SURGERY Right    Procedure: ANKLE SURGERY; screw placement    BREAST BIOPSY  Procedure: BREAST BIOPSY   CESAREAN SECTION, UNSPECIFIED     Procedure: CESAREAN SECTION; 2    COLONOSCOPY     Procedure: COLONOSCOPY   HYSTERECTOMY      Procedure: HYSTERECTOMY   OTHER SURGICAL HISTORY Right 02/04/2017   Procedure: OTHER SURGICAL HISTORY (SLT )   OTHER SURGICAL HISTORY Left 02/11/2017   Procedure: OTHER SURGICAL HISTORY (SLT)   RETINAL DETACHMENT SURGERY Right    Procedure: RETINAL DETACHMENT SURGERY; repair retinal tear   TONSILLECTOMY     Procedure: TONSILLECTOMY   [3] Current Outpatient Medications on File Prior to Visit  Medication Sig Dispense Refill   acetaminophen (TYLENOL) 500 mg tablet Take 500 mg by mouth daily as needed for mild pain (1-3).     amLODIPine (NORVASC) 10 mg tablet Take 1 tablet (10 mg total) by mouth daily. 90 tablet 3   aspirin 81 mg chewable tablet Chew 1 tablet (81 mg total) daily. 30 tablet 2   atorvastatin (LIPITOR) 20 mg tablet Take 1 tablet (20 mg total) by mouth at bedtime. 90 tablet 3   carboxymethylcellulose (Refresh Tears) 0.5 % drop ophthalmic solution Administer 1 drop into each eyes daily as needed for dry eyes.     cetirizine (ZyrTEC) 10 mg tablet Take 1 tablet (10 mg total) by mouth daily. 30 tablet 1   chlorthalidone (HYGROTON) 25 mg tablet Take 0.5 tablets (12.5 mg total) by mouth daily. 45 tablet 3   CHOLECALCIFEROL, VITAMIN D3, ORAL Take 1 tablet by mouth daily.     cyanocobalamin (VITAMIN B12) 1,000 mcg tablet Take 1,000 mcg by mouth Once Daily.     diclofenac sodium (VOLTAREN) 1 % gel APPLY 2 GRAMS TOPICALLY TO THE AFFECTED AREA FOUR TIMES DAILY     famotidine (PEPCID) 40 mg tablet Take 40 mg by mouth daily. PRN     magnesium oxide 400 mg (241 mg magnesium) tab TAKE 1 TABLET(400 MG) BY MOUTH TWICE DAILY 60 tablet 1   phytonadione, vitamin K1, (VITAMIN K) 100 mcg tablet Take 150 mcg by mouth 2 (two) times weekly. Tuesday and thursday     No current facility-administered medications on file prior to visit.  [4] Allergies Allergen Reactions   Soy Hives    Patient states she is not allergic- dr discovered she wasn't   Cyclobenzaprine Other (See Comments)    Faint   Gluten Other (See Comments)    Hives   Spironolactone Hives   Clarithromycin Rash   Lisinopril Rash  [5] Family History Problem Relation Name Age of Onset   Diabetes Mother     Cataracts Mother     Hypertension Mother     Diabetes Father     Glaucoma Father     Cataracts Father     Hypertension Father      Hyperlipidemia Sister     Hypertension Sister     Breast cancer Maternal Aunt Mat Aunt    Stroke Maternal Grandmother     Cataracts Maternal Grandmother     Tuberculosis Maternal Grandfather     Cataracts Maternal Grandfather     Aneurysm Paternal Grandmother     Cataracts Paternal Grandmother     Cataracts Paternal Grandfather     Hypertension Son     Macular degeneration Neg Hx    [6] Social History Socioeconomic History   Marital status: Married  Tobacco Use   Smoking status: Never    Passive exposure: Never   Smokeless tobacco: Never  Vaping Use   Vaping status: Never Used  Substance and Sexual Activity   Alcohol use: Never   Drug use: Never  Social History Narrative   Patient is married (since 32)  Lives in [place] since 2015  Previously from [place]  but then moved [place], [place] 2013 but moved her after husband had aortic dissection.  .   Social Drivers of Health   Food Insecurity: Low Risk  (01/23/2023)   Food vital sign    Within the past 12 months, you worried that your food would run out before you got money to buy more: Never true    Within the past 12 months, the food you bought just didn't last and you didn't have money to get more: Never true  Transportation Needs: No Transportation Needs (01/23/2023)   Transportation    In the past 12 months, has lack of reliable transportation kept you from medical appointments, meetings, work or from getting things needed for daily living? : No  Safety: Low Risk  (12/31/2023)   Safety    How often does anyone, including family and friends, physically hurt you?: Never    How often does anyone, including family and friends, insult or talk down to you?: Never    How often does anyone, including family and friends, threaten you with harm?: Never    How often does anyone, including family and friends, scream or curse at you?: Never  Living Situation: Low Risk  (01/23/2023)   Living Situation    What is  your living situation today?: I have a steady place to live    Think about the place you live. Do you have problems with any of the following? Choose all that apply:: None/None on this list  [7] Patient Active Problem List Diagnosis   Essential hypertension   Hyperlipidemia   Chronic urticaria   Multiple food allergies   Lumbar back pain with radiculopathy affecting left lower extremity   Low back pain   Greater trochanteric bursitis of right hip   Laryngopharyngeal reflux (LPR)   Glaucoma suspect of both eyes   Nuclear sclerotic cataract of both eyes   Cortical age-related cataract of both eyes   Posterior vitreous detachment of both eyes   Myopia with astigmatism and presbyopia, bilateral   Primary open angle glaucoma of both eyes, mild stage   Family history of glaucoma   Keratoconjunctivitis sicca not specified as Sjogren's, bilateral   Vitamin D deficiency   Hypomagnesemia   Class 1 obesity due to excess calories with serious comorbidity and body mass index (BMI) of 34.0 to 34.9 in adult   Sleep disorder breathing   Obstructive sleep apnea  "
